# Patient Record
Sex: Male | Born: 2005 | Race: White | Hispanic: Yes | Marital: Single | Smoking: Never smoker
Health system: Southern US, Community
[De-identification: ages and names within clinical notes are randomized; demographics above are authoritative.]

---
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
## 2024-03-22 ENCOUNTER — Encounter
